# Patient Record
Sex: Female | Born: 1970 | Race: White | Hispanic: No | Marital: Married | Smoking: Never smoker
Health system: Southern US, Community
[De-identification: ages and names within clinical notes are randomized; demographics above are authoritative.]

## PROBLEM LIST (undated history)

## (undated) DIAGNOSIS — Z973 Presence of spectacles and contact lenses: Secondary | ICD-10-CM

## (undated) DIAGNOSIS — N939 Abnormal uterine and vaginal bleeding, unspecified: Secondary | ICD-10-CM

## (undated) DIAGNOSIS — N840 Polyp of corpus uteri: Secondary | ICD-10-CM

## (undated) DIAGNOSIS — N859 Noninflammatory disorder of uterus, unspecified: Secondary | ICD-10-CM

## (undated) DIAGNOSIS — Z8739 Personal history of other diseases of the musculoskeletal system and connective tissue: Secondary | ICD-10-CM

---

## 1995-04-21 DIAGNOSIS — Z8739 Personal history of other diseases of the musculoskeletal system and connective tissue: Secondary | ICD-10-CM

## 1995-04-21 HISTORY — DX: Personal history of other diseases of the musculoskeletal system and connective tissue: Z87.39

## 1995-04-21 HISTORY — PX: OTHER SURGICAL HISTORY: SHX169

## 2015-08-27 ENCOUNTER — Other Ambulatory Visit (HOSPITAL_COMMUNITY)
Admission: RE | Admit: 2015-08-27 | Discharge: 2015-08-27 | Disposition: A | Discharge status: Home / Self Care | Payer: BLUE CROSS/BLUE SHIELD | Source: Ambulatory Visit | Attending: Family Medicine | Admitting: Family Medicine

## 2015-08-27 ENCOUNTER — Other Ambulatory Visit: Payer: Self-pay | Admitting: Family Medicine

## 2015-08-27 DIAGNOSIS — Z1151 Encounter for screening for human papillomavirus (HPV): Secondary | ICD-10-CM | POA: Diagnosis not present

## 2015-08-27 DIAGNOSIS — Z01419 Encounter for gynecological examination (general) (routine) without abnormal findings: Secondary | ICD-10-CM | POA: Insufficient documentation

## 2015-08-28 LAB — CYTOLOGY - PAP

## 2016-05-25 ENCOUNTER — Emergency Department (HOSPITAL_COMMUNITY)
Admission: EM | Admit: 2016-05-25 | Discharge: 2016-05-25 | Disposition: A | Discharge status: Home / Self Care | Payer: 59 | Attending: Emergency Medicine | Admitting: Emergency Medicine

## 2016-05-25 ENCOUNTER — Emergency Department (HOSPITAL_COMMUNITY): Payer: 59

## 2016-05-25 ENCOUNTER — Encounter (HOSPITAL_COMMUNITY): Payer: Self-pay | Admitting: Emergency Medicine

## 2016-05-25 DIAGNOSIS — J09X2 Influenza due to identified novel influenza A virus with other respiratory manifestations: Secondary | ICD-10-CM | POA: Insufficient documentation

## 2016-05-25 DIAGNOSIS — J101 Influenza due to other identified influenza virus with other respiratory manifestations: Secondary | ICD-10-CM

## 2016-05-25 DIAGNOSIS — R05 Cough: Secondary | ICD-10-CM | POA: Diagnosis present

## 2016-05-25 MED ORDER — IBUPROFEN 800 MG PO TABS
800.0000 mg | ORAL_TABLET | Freq: Once | ORAL | Status: AC
  Administered 2016-05-25: 800 mg via ORAL
  Filled 2016-05-25: qty 1

## 2016-05-25 MED ORDER — ONDANSETRON 4 MG PO TBDP: 4.0000 mg | ORAL_TABLET | Freq: Three times a day (TID) | ORAL | 0 refills | Status: DC | PRN

## 2016-05-25 MED ORDER — SODIUM CHLORIDE 0.9 % IV BOLUS (SEPSIS)
1000.0000 mL | Freq: Once | INTRAVENOUS | Status: AC
  Administered 2016-05-25: 1000 mL via INTRAVENOUS

## 2016-05-25 NOTE — ED Provider Notes (Signed)
[facility] DEPT Provider Note   CSN: [id number] Arrival date & time: 05/25/16  [id number]     History   Chief Complaint Chief Complaint  Patient presents with  . Flu Like Symptoms    HPI Tiffany Bond is a 46 y.o. female.  The history is provided by the patient.  Fever   This is a new problem. Episode onset: 5 days ago. The problem occurs constantly. The problem has not changed since onset.The maximum temperature noted was 101 to 101.9 F. The temperature was taken using an oral thermometer. Associated symptoms include diarrhea, vomiting and cough. She has tried acetaminophen (mucinex) for the symptoms. The treatment provided mild relief.    History reviewed. No pertinent past medical history.  There are no active problems to display for this patient.   Past Surgical History:  Procedure Laterality Date  . BREAST SURGERY     Breast Reduction    OB History    No data available       Home Medications    Prior to Admission medications   Not on File    Family History No family history on file.  Social History Social History  Substance Use Topics  . Smoking status: Never Smoker  . Smokeless tobacco: Never Used  . Alcohol use No     Allergies   Vancomycin   Review of Systems Review of Systems  Constitutional: Positive for fever.  Respiratory: Positive for cough.   Gastrointestinal: Positive for diarrhea and vomiting.  All other systems reviewed and are negative.    Physical Exam Updated Vital Signs BP 97/57 (BP Location: Right Arm)   Pulse 88   Temp 103 F (39.4 C) (Oral)   Resp 16   Ht 5' (1.524 m)   Wt 152 lb (68.9 kg)   LMP 10/19/2015   SpO2 100%   BMI 29.69 kg/m   Physical Exam  Constitutional: She is oriented to person, place, and time. She appears well-developed and well-nourished. No distress.  HENT:  Head: Normocephalic.  Nose: Nose normal.  Mouth/Throat: Oropharynx is clear and moist.  Eyes: Conjunctivae are normal. Pupils are  equal, round, and reactive to light.  Neck: Neck supple. No tracheal deviation present.  Cardiovascular: Normal rate, regular rhythm and normal heart sounds.   Pulmonary/Chest: Effort normal and breath sounds normal. No respiratory distress.  Abdominal: Soft. She exhibits no distension. There is no tenderness. There is no rebound and no guarding.  Neurological: She is alert and oriented to person, place, and time.  Skin: Skin is warm and dry.  Psychiatric: She has a normal mood and affect.  Vitals reviewed.    ED Treatments / Results  Labs (all labs ordered are listed, but only abnormal results are displayed) Labs Reviewed - No data to display  EKG  EKG Interpretation None       Radiology Dg Chest 2 View  Result Date: 05/25/2016 CLINICAL DATA:  Cough, chest congestion, shortness of breath, and nausea and vomiting for the past 5 days. Recently diagnosed with flu. Nonsmoker. EXAM: CHEST  2 VIEW COMPARISON:  None in PACs FINDINGS: The lungs are mildly hyperinflated. There is no focal infiltrate. There is no pleural effusion. The heart and pulmonary vascularity are normal. The mediastinum is normal in width. The bony thorax exhibits no acute abnormality. IMPRESSION: Mild hyperinflation may be voluntary or may reflect mild air trapping as might be seen with reactive airway disease or bronchitis. No alveolar pneumonia or CHF. Electronically Signed   By: Shanon Brow  [place] M.D.   On: 05/25/2016 09:01    Procedures Procedures (including critical care time)  Medications Ordered in ED Medications  ibuprofen (ADVIL,MOTRIN) tablet 800 mg (800 mg Oral Given 05/25/16 0858)  sodium chloride 0.9 % bolus 1,000 mL (0 mLs Intravenous Stopped 05/25/16 1136)     Initial Impression / Assessment and Plan / ED Course  I have reviewed the triage vital signs and the nursing notes.  Pertinent labs & imaging results that were available during my care of the patient were reviewed by me and considered in my  medical decision making (see chart for details).     46 y.o. female presents with Cough, nausea, vomiting, diarrhea and fever that is been ongoing for the last 5 days. Overnight she was unable to control her symptoms. She sees Tamiflu late into the course of her symptoms starting yesterday for a flu A positive screen. Given prolonged nature of her fever we will screen her for a secondary bacterial pneumonia with chest x-ray. Currently febrile to 103 but otherwise no SIRS. Treated with supportive care, nausea improved after Zofran with EMS.  Plan to follow up with PCP as needed and return precautions discussed for worsening or new concerning symptoms.   Final Clinical Impressions(s) / ED Diagnoses   Final diagnoses:  Influenza A    New Prescriptions New Prescriptions   No medications on file     Leo Grosser, MD 05/25/16 1954

## 2016-05-25 NOTE — ED Notes (Signed)
Bed: WA02 Expected date:  Expected time:  Means of arrival:  Comments: EMS flu

## 2016-05-25 NOTE — ED Triage Notes (Addendum)
Per EMS, patient was dx with flu by [facility] yesterday. Patient is complaining of nausea, vomiting, diarrhea, cough, generalized body aches, fever. Symptoms starting Wednesday 05/20/2016. Patient received 600 ml NS IV, 4 mg zofran IV, 1000 mg tylenol PO

## 2017-02-24 ENCOUNTER — Ambulatory Visit: Payer: Self-pay | Admitting: Obstetrics & Gynecology

## 2017-03-11 IMAGING — CR DG CHEST 2V
2 series · 2 of 2 positions shown · non-contrast
Comparison: None in PACs

CLINICAL DATA: Cough, chest congestion, shortness of breath, and
nausea and vomiting for the past 5 days. Recently diagnosed with
flu. Nonsmoker.

EXAM:
CHEST  2 VIEW

[w chest pa]
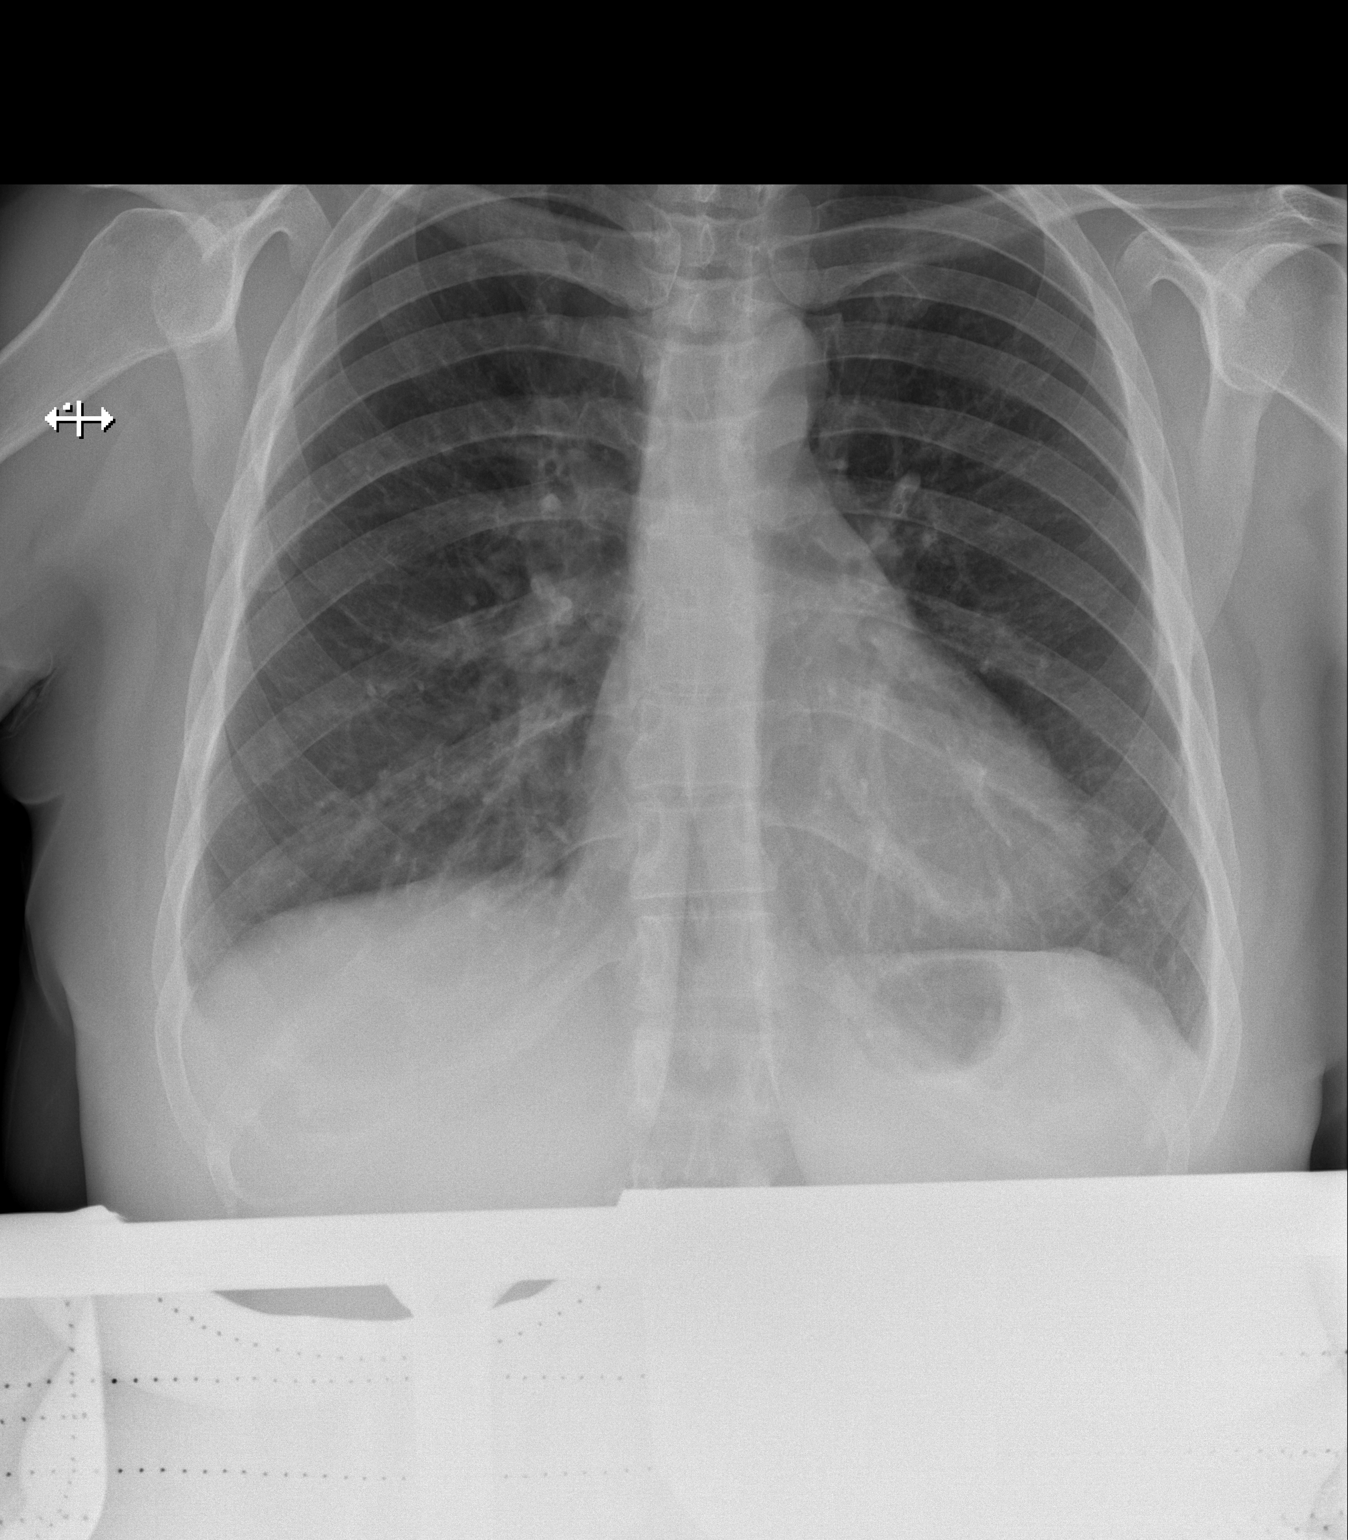

[w chest lat]
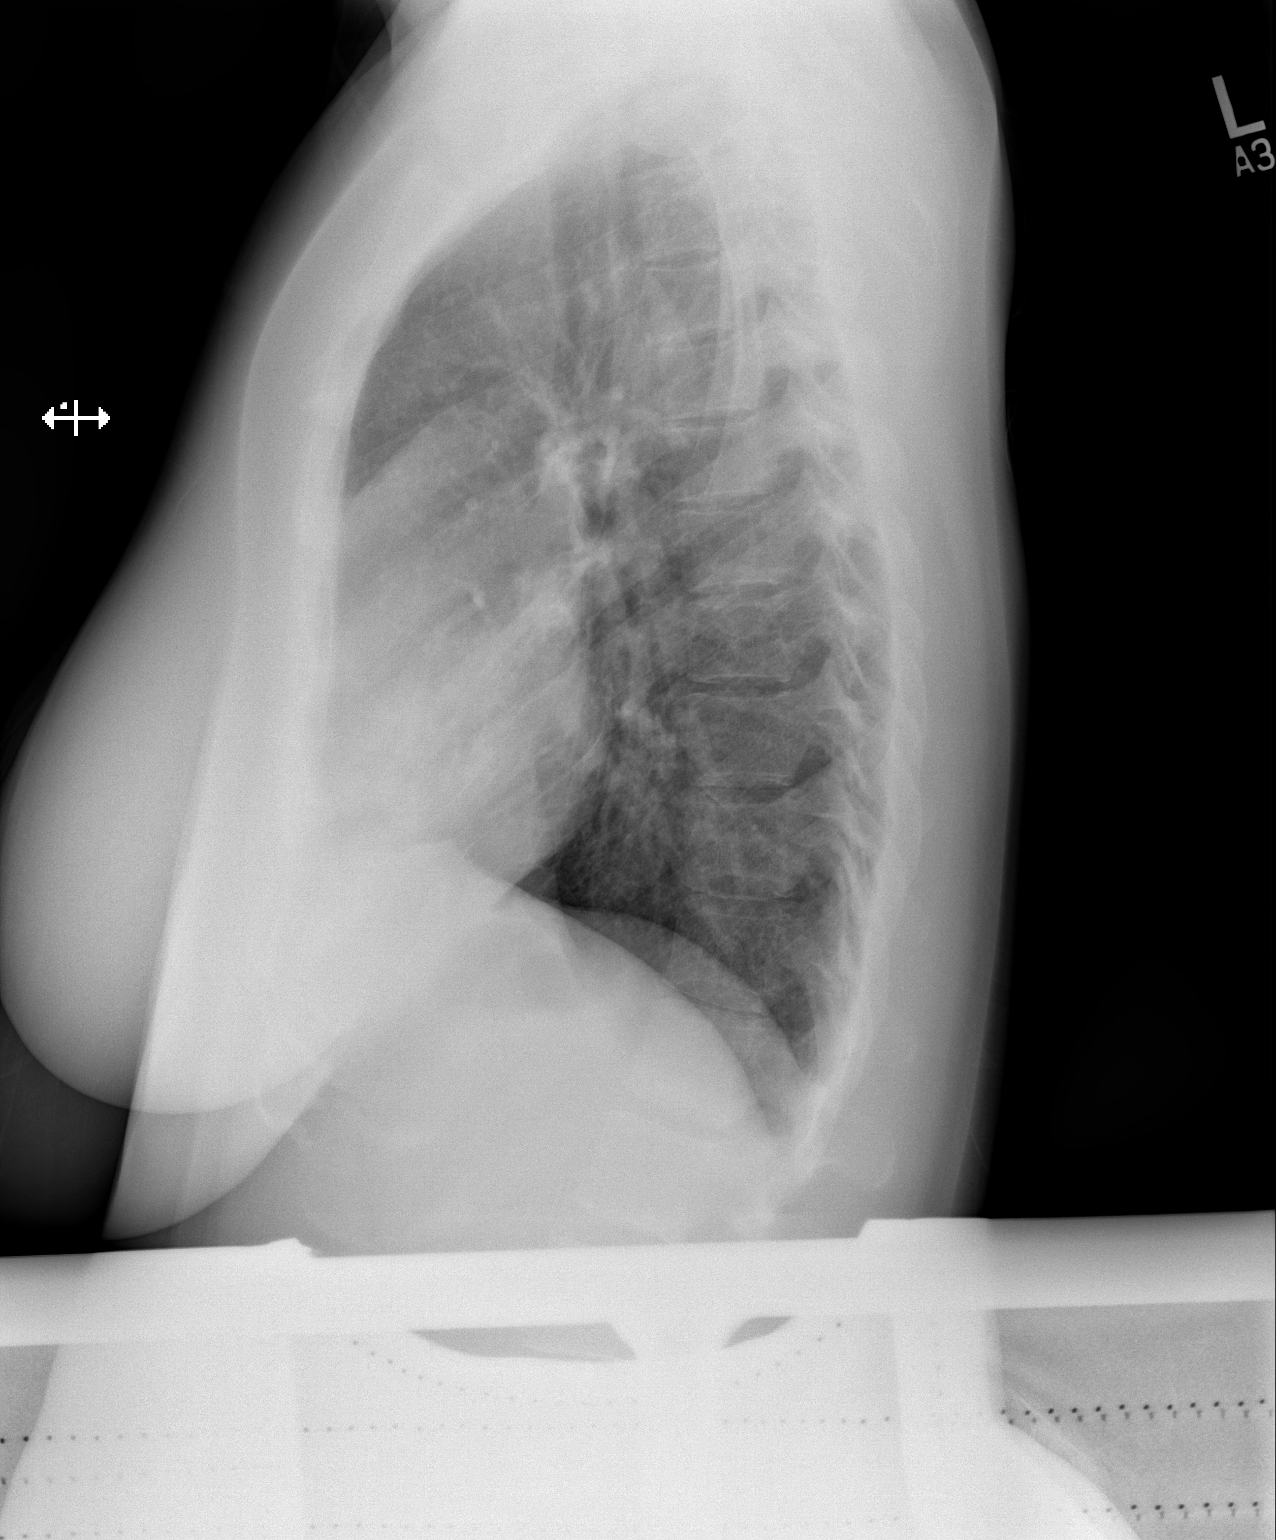

[2 of 2 positions shown; findings below may reference images not displayed]

FINDINGS: The lungs are mildly hyperinflated. There is no focal infiltrate.
There is no pleural effusion. The heart and pulmonary vascularity
are normal. The mediastinum is normal in width. The bony thorax
exhibits no acute abnormality.
IMPRESSION: Mild hyperinflation may be voluntary or may reflect mild air
trapping as might be seen with reactive airway disease or
bronchitis. No alveolar pneumonia or CHF.

## 2017-04-15 ENCOUNTER — Ambulatory Visit: Payer: Self-pay | Admitting: Obstetrics & Gynecology

## 2017-05-13 ENCOUNTER — Ambulatory Visit (INDEPENDENT_AMBULATORY_CARE_PROVIDER_SITE_OTHER): Payer: 59 | Admitting: Obstetrics & Gynecology

## 2017-05-13 ENCOUNTER — Encounter: Payer: Self-pay | Admitting: Obstetrics & Gynecology

## 2017-05-13 VITALS — BP 126/72 | Ht 59.0 in | Wt 169.0 lb

## 2017-05-13 DIAGNOSIS — N914 Secondary oligomenorrhea: Secondary | ICD-10-CM | POA: Diagnosis not present

## 2017-05-13 DIAGNOSIS — Z30011 Encounter for initial prescription of contraceptive pills: Secondary | ICD-10-CM

## 2017-05-13 DIAGNOSIS — Z1151 Encounter for screening for human papillomavirus (HPV): Secondary | ICD-10-CM | POA: Diagnosis not present

## 2017-05-13 DIAGNOSIS — Z01419 Encounter for gynecological examination (general) (routine) without abnormal findings: Secondary | ICD-10-CM | POA: Diagnosis not present

## 2017-05-13 MED ORDER — NORETHINDRONE 0.35 MG PO TABS: 1.0000 | ORAL_TABLET | Freq: Every day | ORAL | 4 refills | Status: DC

## 2017-05-13 NOTE — Patient Instructions (Signed)
1. Encounter for routine gynecological examination with Papanicolaou smear of cervix Normal gynecologic exam.  Pap test with high risk HPV done.  Breast exam normal status post bilateral breast reduction.  Will schedule screening mammogram.  Health labs with family physician.  Walks her dog daily.  Recommend lower calorie/carb nutrition.  2. Secondary oligomenorrhea Oligomenorrhea probably associated with oligo-ovulation of perimenopause.  Will check FSH and TSH today.  As a protection of the endometrium and for contraception, patient will start on the progestin-only birth control pill.  Usage, risks and benefits reviewed.  Prescription sent.  Patient will follow up to evaluate the endometrial line with a pelvic ultrasound. - FSH - TSH - US Transvaginal Non-OB; Future  3. Encounter for initial prescription of contraceptive pills Decision to start on the progestin only birth control pill.  No contraindication.  Usage, risks and benefits reviewed.  Prescription sent.  Other orders - norethindrone (MICRONOR,CAMILA,ERRIN) 0.35 MG tablet; Take 1 tablet (0.35 mg total) by mouth daily.  Tiffany Bond, it was a pleasure meeting you today!  I will inform you of your results as soon as they are available.  Perimenopause Perimenopause is the time when your body begins to move into the menopause (no menstrual period for 12 straight months). It is a natural process. Perimenopause can begin 2-8 years before the menopause and usually lasts for 1 year after the menopause. During this time, your ovaries may or may not produce an egg. The ovaries vary in their production of estrogen and progesterone hormones each month. This can cause irregular menstrual periods, difficulty getting pregnant, vaginal bleeding between periods, and uncomfortable symptoms. What are the causes?  Irregular production of the ovarian hormones, estrogen and progesterone, and not ovulating every month. Other causes include:  Tumor of the  pituitary gland in the brain.  Medical disease that affects the ovaries.  Radiation treatment.  Chemotherapy.  Unknown causes.  Heavy smoking and excessive alcohol intake can bring on perimenopause sooner.  What are the signs or symptoms?  Hot flashes.  Night sweats.  Irregular menstrual periods.  Decreased sex drive.  Vaginal dryness.  Headaches.  Mood swings.  Depression.  Memory problems.  Irritability.  Tiredness.  Weight gain.  Trouble getting pregnant.  The beginning of losing bone cells (osteoporosis).  The beginning of hardening of the arteries (atherosclerosis). How is this diagnosed? Your health care provider will make a diagnosis by analyzing your age, menstrual history, and symptoms. He or she will do a physical exam and note any changes in your body, especially your female organs. Female hormone tests may or may not be helpful depending on the amount of female hormones you produce and when you produce them. However, other hormone tests may be helpful to rule out other problems. How is this treated? In some cases, no treatment is needed. The decision on whether treatment is necessary during the perimenopause should be made by you and your health care provider based on how the symptoms are affecting you and your lifestyle. Various treatments are available, such as:  Treating individual symptoms with a specific medicine for that symptom.  Herbal medicines that can help specific symptoms.  Counseling.  Group therapy.  Follow these instructions at home:  Keep track of your menstrual periods (when they occur, how heavy they are, how long between periods, and how long they last) as well as your symptoms and when they started.  Only take over-the-counter or prescription medicines as directed by your health care provider.  Sleep and  rest.  Exercise.  Eat a diet that contains calcium (good for your bones) and soy (acts like the estrogen  hormone).  Do not smoke.  Avoid alcoholic beverages.  Take vitamin supplements as recommended by your health care provider. Taking vitamin E may help in certain cases.  Take calcium and vitamin D supplements to help prevent bone loss.  Group therapy is sometimes helpful.  Acupuncture may help in some cases. Contact a health care provider if:  You have questions about any symptoms you are having.  You need a referral to a specialist (gynecologist, psychiatrist, or psychologist). Get help right away if:  You have vaginal bleeding.  Your period lasts longer than 8 days.  Your periods are recurring sooner than 21 days.  You have bleeding after intercourse.  You have severe depression.  You have pain when you urinate.  You have severe headaches.  You have vision problems. This information is not intended to replace advice given to you by your health care provider. Make sure you discuss any questions you have with your health care provider. Document Released: 05/14/2004 Document Revised: 09/12/2015 Document Reviewed: 11/03/2012 Elsevier Interactive Patient Education  2017 [employer].

## 2017-05-13 NOTE — Progress Notes (Signed)
Tiffany Bond 07-Apr-1971 [id number]   History:    47 y.o. G2P1A1L1 Married x 15 years.  Daughter is 5 yo, doing well.  RP:  Established patient presenting for annual gyn exam   HPI:  Last menstrual period, light flow 11/2016.  Oligo x about 1 1/2 year.  Constant cramping and PMS symptoms.  Rare hot flushes.  No night sweats.  No problem with IC, using non-latex condoms.  Breasts wnl.  S/P Bilateral Breast reduction.  Urine and BMs wnl.  BMI 34.13.  Past medical history,surgical history, family history and social history were all reviewed and documented in the EPIC chart.  Gynecologic History Patient's last menstrual period was 11/26/2016. Contraception: condoms Last Pap: 2017. Results were: normal Last mammogram: 3 years ago. Results were: normal  Obstetric History OB History  Gravida Para Term Preterm AB Living  2 1     1 1   SAB TAB Ectopic Multiple Live Births  1            # Outcome Date GA Lbr Len/2nd Weight Sex Delivery Anes PTL Lv  2 SAB           1 Para                ROS: A ROS was performed and pertinent positives and negatives are included in the history.  GENERAL: No fevers or chills. HEENT: No change in vision, no earache, sore throat or sinus congestion. NECK: No pain or stiffness. CARDIOVASCULAR: No chest pain or pressure. No palpitations. PULMONARY: No shortness of breath, cough or wheeze. GASTROINTESTINAL: No abdominal pain, nausea, vomiting or diarrhea, melena or bright red blood per rectum. GENITOURINARY: No urinary frequency, urgency, hesitancy or dysuria. MUSCULOSKELETAL: No joint or muscle pain, no back pain, no recent trauma. DERMATOLOGIC: No rash, no itching, no lesions. ENDOCRINE: No polyuria, polydipsia, no heat or cold intolerance. No recent change in weight. HEMATOLOGICAL: No anemia or easy bruising or bleeding. NEUROLOGIC: No headache, seizures, numbness, tingling or weakness. PSYCHIATRIC: No depression, no loss of interest in normal activity or change  in sleep pattern.     Exam:   BP 126/72   Ht 4\' 11"  (1.499 m)   Wt 169 lb (76.7 kg)   LMP 11/26/2016   BMI 34.13 kg/m   Body mass index is 34.13 kg/m.  General appearance : Well developed well nourished female. No acute distress HEENT: Eyes: no retinal hemorrhage or exudates,  Neck supple, trachea midline, no carotid bruits, no thyroidmegaly Lungs: Clear to auscultation, no rhonchi or wheezes, or rib retractions  Heart: Regular rate and rhythm, no murmurs or gallops Breast:Examined in sitting and supine position were symmetrical in appearance, no palpable masses or tenderness,  no skin retraction, no nipple inversion, no nipple discharge, no skin discoloration, no axillary or supraclavicular lymphadenopathy Abdomen: no palpable masses or tenderness, no rebound or guarding Extremities: no edema or skin discoloration or tenderness  Pelvic: Vulva normal  Bartholin, Urethra, Skene Glands: Within normal limits             Vagina: No gross lesions or discharge  Cervix: No gross lesions or discharge.  Pap/HR HPV done.  Uterus  AV, normal size, shape and consistency, non-tender and mobile  Adnexa  Without masses or tenderness  Anus and perineum  normal    Assessment/Plan:  48 y.o. female for annual exam  1. Encounter for routine gynecological examination with Papanicolaou smear of cervix Normal gynecologic exam.  Pap test with high risk HPV  done.  Breast exam normal status post bilateral breast reduction.  Will schedule screening mammogram.  Health labs with family physician.  Walks her dog daily.  Recommend lower calorie/carb nutrition.  2. Secondary oligomenorrhea Oligomenorrhea probably associated with oligo-ovulation of perimenopause.  Will check FSH and TSH today.  As a protection of the endometrium and for contraception, patient will start on the progestin-only birth control pill.  Usage, risks and benefits reviewed.  Prescription sent.  Patient will follow up to evaluate the  endometrial line with a pelvic ultrasound. - FSH - TSH - US Transvaginal Non-OB; Future  3. Encounter for initial prescription of contraceptive pills Decision to start on the progestin only birth control pill.  No contraindication.  Usage, risks and benefits reviewed.  Prescription sent.  Other orders - norethindrone (MICRONOR,CAMILA,ERRIN) 0.35 MG tablet; Take 1 tablet (0.35 mg total) by mouth daily.  Princess Bruins MD, 11:06 AM 05/13/2017

## 2017-05-13 NOTE — Addendum Note (Signed)
Addended by: Thurnell Garbe A on: 05/13/2017 11:57 AM   Modules accepted: Orders

## 2017-05-16 LAB — TEST AUTHORIZATION

## 2017-05-16 LAB — T4, FREE: FREE T4: 1.3 ng/dL (ref 0.8–1.8)

## 2017-05-16 LAB — FOLLICLE STIMULATING HORMONE: FSH: 75 m[IU]/mL

## 2017-05-16 LAB — T3, FREE: T3, Free: 3 pg/mL (ref 2.3–4.2)

## 2017-05-16 LAB — TSH: TSH: 4.59 m[IU]/L — AB

## 2017-05-17 LAB — PAP, TP IMAGING W/ HPV RNA, RFLX HPV TYPE 16,18/45: HPV DNA High Risk: NOT DETECTED

## 2017-05-19 ENCOUNTER — Telehealth: Payer: Self-pay

## 2017-05-19 NOTE — Telephone Encounter (Signed)
-----   Message from Princess Bruins, MD sent at 05/18/2017 10:09 AM EST ----- Pap negative, HPV HR neg.

## 2017-05-19 NOTE — Telephone Encounter (Signed)
Called patient and informed her normal pap. She asked about her Thyroid testing. Her TSH was abnormal and you ordered T3 and T4.  What to advise?

## 2017-05-20 NOTE — Telephone Encounter (Signed)
FT3, FT4 normal.  Repeat TSH next year.

## 2017-05-20 NOTE — Telephone Encounter (Signed)
Spoke with patient and informed her. °

## 2017-05-20 NOTE — Telephone Encounter (Signed)
Please tell her that we are waiting on FT3, FT4 for a complete evaluation of her Thyroid function and will let her know as soon as available.

## 2017-05-20 NOTE — Telephone Encounter (Signed)
I see T3 and T4 results from 05/13/17 in her chart already.

## 2017-06-09 ENCOUNTER — Ambulatory Visit (INDEPENDENT_AMBULATORY_CARE_PROVIDER_SITE_OTHER): Payer: 59

## 2017-06-09 ENCOUNTER — Encounter: Payer: Self-pay | Admitting: Obstetrics & Gynecology

## 2017-06-09 ENCOUNTER — Ambulatory Visit (INDEPENDENT_AMBULATORY_CARE_PROVIDER_SITE_OTHER): Payer: 59 | Admitting: Obstetrics & Gynecology

## 2017-06-09 ENCOUNTER — Other Ambulatory Visit: Payer: Self-pay | Admitting: Obstetrics & Gynecology

## 2017-06-09 VITALS — BP 120/78

## 2017-06-09 DIAGNOSIS — N914 Secondary oligomenorrhea: Secondary | ICD-10-CM | POA: Diagnosis not present

## 2017-06-09 DIAGNOSIS — N951 Menopausal and female climacteric states: Secondary | ICD-10-CM | POA: Diagnosis not present

## 2017-06-09 DIAGNOSIS — R9389 Abnormal findings on diagnostic imaging of other specified body structures: Secondary | ICD-10-CM

## 2017-06-09 DIAGNOSIS — D251 Intramural leiomyoma of uterus: Secondary | ICD-10-CM | POA: Diagnosis not present

## 2017-06-09 NOTE — Progress Notes (Signed)
    Tiffany Bond 1971-01-30 [id number]        46 y.o.  G2P0011 Married  RP: Perimenopausal irregular bleeding with pelvic pain for Pelvic US  HPI: Started on Progestin only pill x 05/13/2017.  Had a light menstrual period o who n it June 06, 2017.  No pelvic pain currently.   OB History  Gravida Para Term Preterm AB Living  2 1     1 1   SAB TAB Ectopic Multiple Live Births  1            # Outcome Date GA Lbr Len/2nd Weight Sex Delivery Anes PTL Lv  2 SAB           1 Para               Past medical history,surgical history, problem list, medications, allergies, family history and social history were all reviewed and documented in the EPIC chart.   Directed ROS with pertinent positives and negatives documented in the history of present illness/assessment and plan.  Exam:  Vitals:   06/09/17 1059  BP: 120/78   General appearance:  Normal  Pelvic US today: T/V and T/A images.  Anteverted uterus measuring 8.37 x 7.35 x 4.74 cm.  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  Endometrial lining measured at 6.8 mm with a cystic/solid focus measuring 1.2 x 0.7 cm.  Right ovary not seen because of excessive bowel shadows.  Left ovary with 2 small cystic areas with internal low-level echoes and negative color flow Doppler, measuring 2.1 x 2.0 cm and 1.1 x 1.0 cm.  Small amount of free fluid in the posterior cul-de-sac measuring 1.4 x 1.0 cm.  Labs on 05/13/2017: [place] [facility] TSH 4.59, FT3 3.0, FT4 1.3.   Assessment/Plan:  47 y.o. G2P0011   1. Perimenopause Started on progestin only pill.  Well-tolerated.  2. Increased endometrial stripe thickness Endometrial focused cystic/solid measuring 1.2 x 0.7 cm.  Possible polyp.  Follow-up sonohysterogram to assess, possible endometrial biopsy. - [place] Sonohysterogram; Future  Counseling on above issues more than 50% for 15 minutes.  Princess Bruins MD, 11:14 AM 06/09/2017

## 2017-06-11 ENCOUNTER — Encounter: Payer: Self-pay | Admitting: Obstetrics & Gynecology

## 2017-06-11 NOTE — Patient Instructions (Addendum)
1. Perimenopause Started on progestin only pill.  Well-tolerated.  2. Increased endometrial stripe thickness Endometrial focused cystic/solid measuring 1.2 x 0.7 cm.  Possible polyp.  Follow-up sonohysterogram to assess, possible endometrial biopsy. - [place] Sonohysterogram; Future  Tiffany Bond, good seeing you today!   Sonohysterogram A sonohysterogram is a procedure to examine the inside of the uterus. This exam uses sound waves that are sent to a computer to make images of the lining of the uterus (endometrium). To get the best images, a germ-free, salt-water solution (sterile saline) is put into the uterus through the vagina. You may have this procedure if you have certain reproductive problems, such as abnormal bleeding, infertility, or miscarriage. This procedure can show what may be causing these problems. Possible causes include scarring or abnormal growths such as fibroids inside your uterus. It can also show if your uterus is an abnormal shape or if the lining of the uterus is too thin. Tell a health care provider about:  All medicines you are taking, including vitamins, herbs, eye drops, creams, and over-the-counter medicines.  Any allergies you have.  Any blood disorders you have.  Any surgeries you have had.  Any medical conditions you have.  Whether you are pregnant or may be pregnant.  The date of the first day of your last period.  Any signs of infection, such as fever, pain in your lower abdomen, or abnormal discharge from your vagina. What are the risks? Generally, this is a safe procedure. However, problems may occur, including:  Abdominal pain or cramping.  Light bleeding (spotting).  Increased vaginal discharge.  Infection.  What happens before the procedure?  Your health care provider may have you take an over-the-counter pain medicine.  You may be given medicine to stop any abnormal bleeding.  You may be given antibiotic medicine to help prevent  infection.  You may be asked to take a pregnancy test. This is usually in the form of a urine test.  You may have a pelvic exam.  You will be asked to empty your bladder. What happens during the procedure?  You will lie down on the exam table with your feet in stirrups or with your knees bent and your feet flat on the table.  A slender, handheld device (transducer) will be lubricated and placed into your vagina.  The transducer will be positioned to send sound waves to your uterus. The sound waves are sent to a computer and are turned into images, which your health care provider sees during the procedure.  The transducer will be removed from your vagina.  An instrument will be inserted to widen the opening of your vagina (speculum).  A swab with germ-killing solution (antiseptic) will be used to clean the opening to your uterus (cervix).  A long, thin tube (catheter) will be placed through your cervix into your uterus.  The speculum will be removed.  The transducer will be placed back into your vagina to take more images.  Your uterus will be filled with a germ-free, salt-water solution (sterile saline) through the catheter. You may feel some cramping.  A fluid that contains air bubbles may be sent through the catheter to make it easier to see the fallopian tubes.  The transducer and catheter will be removed. The procedure may vary among health care providers and hospitals. What happens after the procedure?  It is up to you to get the results of your procedure. Ask your health care provider, or the department that is doing the procedure,  when your results will be ready. Summary  A sonohysterogram is a procedure that creates images of the inside of the uterus.  The risks of this procedure are very low. Most women experience cramping and spotting after the procedure.  You may need to have a pelvic exam and take a pregnancy test before this procedure. This procedure will not be  done if you are pregnant or have an infection. This information is not intended to replace advice given to you by your health care provider. Make sure you discuss any questions you have with your health care provider. Document Released: 08/21/2013 Document Revised: 03/02/2016 Document Reviewed: 03/02/2016 Elsevier Interactive Patient Education  2017 [employer].

## 2017-06-16 ENCOUNTER — Other Ambulatory Visit: Payer: Self-pay | Admitting: Obstetrics & Gynecology

## 2017-06-16 ENCOUNTER — Ambulatory Visit (INDEPENDENT_AMBULATORY_CARE_PROVIDER_SITE_OTHER): Payer: 59 | Admitting: Obstetrics & Gynecology

## 2017-06-16 ENCOUNTER — Telehealth: Payer: Self-pay

## 2017-06-16 ENCOUNTER — Ambulatory Visit (INDEPENDENT_AMBULATORY_CARE_PROVIDER_SITE_OTHER): Payer: 59

## 2017-06-16 DIAGNOSIS — N9489 Other specified conditions associated with female genital organs and menstrual cycle: Secondary | ICD-10-CM | POA: Diagnosis not present

## 2017-06-16 DIAGNOSIS — R9389 Abnormal findings on diagnostic imaging of other specified body structures: Secondary | ICD-10-CM | POA: Diagnosis not present

## 2017-06-16 DIAGNOSIS — N840 Polyp of corpus uteri: Secondary | ICD-10-CM

## 2017-06-16 DIAGNOSIS — N950 Postmenopausal bleeding: Secondary | ICD-10-CM | POA: Diagnosis not present

## 2017-06-16 NOTE — Progress Notes (Signed)
Tiffany Bond [birth year] [id number]        47 y.o.  G2P0011   RP: PMB with IU focus for Sonohysterogram  HPI: No change since visit on 06/09/2017 when we noted:  Started on Progestin only pill x 05/13/2017.  Had a light menstrual period on it June 06, 2017.  No pelvic pain currently.  Pelvic US on 06/09/2017 showed an IU focus 1.2 x 0.7 cm.   OB History  Gravida Para Term Preterm AB Living  2 1     1 1   SAB TAB Ectopic Multiple Live Births  1            # Outcome Date GA Lbr Len/2nd Weight Sex Delivery Anes PTL Lv  2 SAB           1 Para               Past medical history,surgical history, problem list, medications, allergies, family history and social history were all reviewed and documented in the EPIC chart.   Directed ROS with pertinent positives and negatives documented in the history of present illness/assessment and plan.  Exam:  There were no vitals filed for this visit. General appearance:  Normal                                                                    Sono Infusion Hysterogram ( procedure note)   The initial transvaginal ultrasound demonstrated the following: T/V images.  No change since prior ultrasound.  Anteverted uterus with intramural fibroid measuring 4.6 x 3.3 cm and displacing the endometrium.  Cystic area in the endometrium.  Right ovary seen behind the uterus and normal.  Left ovary with 2 small cystic areas measuring 1.6 x 1.4 cm and 1.5 x 1.1 cm.  Negative color flow Doppler.  No free fluid in the posterior to the sac.  The speculum  was inserted and the cervix cleansed with Betadine solution after confirming that patient has no allergies.A small sonohysterography catheterwas utilized.  Insertion was facilitated with ring forceps, using a spear-like motion the catheter was inserted to the fundus of the uterus. The speculum is then removed carefully to avoid dislodging the catheter. The catheter was flushed with sterile saline delete prior  to insertion to rid it of small amounts of air.the sterile saline solution was infused into the uterine cavity as a vaginal ultrasound probe was then placed in the vagina for full visualization of the uterine cavity from a transvaginal approach. The following was noted: Following injection of saline, the endometrium is filled and  Defects are seen measuring 7 x 6 mm and 8 x 5 mm.  The intramural fibroid is not encroaching the endometrial cavity.  The catheter was then removed after retrieving some of the saline from the intrauterine cavity. An endometrial biopsy was not done. Patient tolerated procedure well. She had received a tablet of Aleve for discomfort.    Assessment/Plan:  47 y.o. G2P0011   1. Postmenopausal bleeding Probably due to endometrial polyps  2. Endometrial polyps 2 intrauterine defects seen on sonohysterogram measuring 7 mm and 8 mm, probably corresponding to endometrial polyps.  Decision to proceed with hysteroscopy with Myosure resection of polyps and D&C.  Surgery and risks reviewed.  Follow-up preop evaluation.    Counseling on above issues more than 50% for 15 minutes.  Princess Bruins MD, 9:26 AM 06/16/2017

## 2017-06-16 NOTE — Telephone Encounter (Signed)
I called patient to discuss arranging surgery. We discussed her insurance benefits and estimated GGA surgery prepymt due by one week prior to surgery.  We discussed dates. She wants to consider and call me back to schedule.

## 2017-06-17 ENCOUNTER — Encounter: Payer: Self-pay | Admitting: Obstetrics & Gynecology

## 2017-06-17 NOTE — Patient Instructions (Signed)
1. Postmenopausal bleeding Probably due to endometrial polyps  2. Endometrial polyps 2 intrauterine defects seen on sonohysterogram measuring 7 mm and 8 mm, probably corresponding to endometrial polyps.  Decision to proceed with hysteroscopy with Myosure resection of polyps and D&C.  Surgery and risks reviewed.  Follow-up preop evaluation.    Sharyn Lull, good seeing you today!  Hysteroscopy Hysteroscopy is a procedure used for looking inside the womb (uterus). It may be done for various reasons, including:  To evaluate abnormal bleeding, fibroid (benign, noncancerous) tumors, polyps, scar tissue (adhesions), and possibly cancer of the uterus.  To look for lumps (tumors) and other uterine growths.  To look for causes of why a woman cannot get pregnant (infertility), causes of recurrent loss of pregnancy (miscarriages), or a lost intrauterine device (IUD).  To perform a sterilization by blocking the fallopian tubes from inside the uterus.  In this procedure, a thin, flexible tube with a tiny light and camera on the end of it (hysteroscope) is used to look inside the uterus. A hysteroscopy should be done right after a menstrual period to be sure you are not pregnant. LET [facility] CARE PROVIDER KNOW ABOUT:  Any allergies you have.  All medicines you are taking, including vitamins, herbs, eye drops, creams, and over-the-counter medicines.  Previous problems you or members of your family have had with the use of anesthetics.  Any blood disorders you have.  Previous surgeries you have had.  Medical conditions you have. RISKS AND COMPLICATIONS Generally, this is a safe procedure. However, as with any procedure, complications can occur. Possible complications include:  Putting a hole in the uterus.  Excessive bleeding.  Infection.  Damage to the cervix.  Injury to other organs.  Allergic reaction to medicines.  Too much fluid used in the uterus for the procedure.  BEFORE THE  PROCEDURE  Ask your health care provider about changing or stopping any regular medicines.  Do not take aspirin or blood thinners for 1 week before the procedure, or as directed by your health care provider. These can cause bleeding.  If you smoke, do not smoke for 2 weeks before the procedure.  In some cases, a medicine is placed in the cervix the day before the procedure. This medicine makes the cervix have a larger opening (dilate). This makes it easier for the instrument to be inserted into the uterus during the procedure.  Do not eat or drink anything for at least 8 hours before the surgery.  Arrange for someone to take you home after the procedure. PROCEDURE  You may be given a medicine to relax you (sedative). You may also be given one of the following: ? A medicine that numbs the area around the cervix (local anesthetic). ? A medicine that makes you sleep through the procedure (general anesthetic).  The hysteroscope is inserted through the vagina into the uterus. The camera on the hysteroscope sends a picture to a TV screen. This gives the surgeon a good view inside the uterus.  During the procedure, air or a liquid is put into the uterus, which allows the surgeon to see better.  Sometimes, tissue is gently scraped from inside the uterus. These tissue samples are sent to a lab for testing. What to expect after the procedure  If you had a general anesthetic, you may be groggy for a couple hours after the procedure.  If you had a local anesthetic, you will be able to go home as soon as you are stable and feel  ready.  You may have some cramping. This normally lasts for a couple days.  You may have bleeding, which varies from light spotting for a few days to menstrual-like bleeding for 3-7 days. This is normal.  If your test results are not back during the visit, make an appointment with your health care provider to find out the results. This information is not intended to  replace advice given to you by your health care provider. Make sure you discuss any questions you have with your health care provider. Document Released: 07/13/2000 Document Revised: 09/12/2015 Document Reviewed: 11/03/2012 Elsevier Interactive Patient Education  2017 [employer].

## 2017-06-21 ENCOUNTER — Encounter: Payer: Self-pay | Admitting: Anesthesiology

## 2017-06-21 ENCOUNTER — Telehealth: Payer: Self-pay

## 2017-06-21 NOTE — Telephone Encounter (Signed)
Patient called today asking to r/s her surgery date. I moved her to 07/02/17 at 7:30am at her request. Patient informed.

## 2017-06-23 ENCOUNTER — Encounter (HOSPITAL_BASED_OUTPATIENT_CLINIC_OR_DEPARTMENT_OTHER): Payer: Self-pay | Admitting: *Deleted

## 2017-06-23 ENCOUNTER — Other Ambulatory Visit: Payer: Self-pay

## 2017-06-23 NOTE — Progress Notes (Signed)
SPOKE W/ PT VIA PHONE FOR PRE-OP INTERVIEW.  ARRIVE AT 0530.  NEEDS URINE PREG.  GETTING CBC DONE Tuesday 06-29-2017 AT 1300. WILL TAKE AM MED DOS W/ SIPS OF WATER.

## 2017-06-29 ENCOUNTER — Encounter (HOSPITAL_COMMUNITY)
Admission: RE | Admit: 2017-06-29 | Discharge: 2017-06-29 | Disposition: A | Discharge status: Home / Self Care | Payer: 59 | Source: Ambulatory Visit | Attending: Obstetrics & Gynecology | Admitting: Obstetrics & Gynecology

## 2017-06-29 DIAGNOSIS — N840 Polyp of corpus uteri: Secondary | ICD-10-CM | POA: Diagnosis not present

## 2017-06-29 DIAGNOSIS — N939 Abnormal uterine and vaginal bleeding, unspecified: Secondary | ICD-10-CM | POA: Diagnosis not present

## 2017-06-29 DIAGNOSIS — Z79890 Hormone replacement therapy (postmenopausal): Secondary | ICD-10-CM | POA: Diagnosis not present

## 2017-06-29 LAB — CBC
HCT: 42.7 % (ref 36.0–46.0)
Hemoglobin: 14.4 g/dL (ref 12.0–15.0)
MCH: 29.6 pg (ref 26.0–34.0)
MCHC: 33.7 g/dL (ref 30.0–36.0)
MCV: 87.9 fL (ref 78.0–100.0)
PLATELETS: 329 10*3/uL (ref 150–400)
RBC: 4.86 MIL/uL (ref 3.87–5.11)
RDW: 13.3 % (ref 11.5–15.5)
WBC: 8.1 10*3/uL (ref 4.0–10.5)

## 2017-07-02 ENCOUNTER — Ambulatory Visit (HOSPITAL_BASED_OUTPATIENT_CLINIC_OR_DEPARTMENT_OTHER): Payer: 59 | Admitting: Anesthesiology

## 2017-07-02 ENCOUNTER — Ambulatory Visit (HOSPITAL_BASED_OUTPATIENT_CLINIC_OR_DEPARTMENT_OTHER)
Admission: RE | Admit: 2017-07-02 | Discharge: 2017-07-02 | Disposition: A | Discharge status: Home / Self Care | Payer: 59 | Source: Ambulatory Visit | Attending: Obstetrics & Gynecology | Admitting: Obstetrics & Gynecology

## 2017-07-02 ENCOUNTER — Encounter (HOSPITAL_BASED_OUTPATIENT_CLINIC_OR_DEPARTMENT_OTHER)
Admission: RE | Disposition: A | Discharge status: Home / Self Care | Payer: Self-pay | Source: Ambulatory Visit | Attending: Obstetrics & Gynecology

## 2017-07-02 ENCOUNTER — Encounter (HOSPITAL_BASED_OUTPATIENT_CLINIC_OR_DEPARTMENT_OTHER): Payer: Self-pay

## 2017-07-02 DIAGNOSIS — Z79890 Hormone replacement therapy (postmenopausal): Secondary | ICD-10-CM | POA: Insufficient documentation

## 2017-07-02 DIAGNOSIS — N939 Abnormal uterine and vaginal bleeding, unspecified: Secondary | ICD-10-CM | POA: Insufficient documentation

## 2017-07-02 DIAGNOSIS — N840 Polyp of corpus uteri: Secondary | ICD-10-CM

## 2017-07-02 HISTORY — DX: Presence of spectacles and contact lenses: Z97.3

## 2017-07-02 HISTORY — DX: Abnormal uterine and vaginal bleeding, unspecified: N93.9

## 2017-07-02 HISTORY — DX: Polyp of corpus uteri: N84.0

## 2017-07-02 HISTORY — DX: Personal history of other diseases of the musculoskeletal system and connective tissue: Z87.39

## 2017-07-02 HISTORY — PX: DILATATION & CURETTAGE/HYSTEROSCOPY WITH MYOSURE: SHX6511

## 2017-07-02 HISTORY — DX: Noninflammatory disorder of uterus, unspecified: N85.9

## 2017-07-02 LAB — POCT PREGNANCY, URINE: PREG TEST UR: NEGATIVE

## 2017-07-02 SURGERY — DILATATION & CURETTAGE/HYSTEROSCOPY WITH MYOSURE
Anesthesia: General | Site: Uterus

## 2017-07-02 MED ORDER — LIDOCAINE 2% (20 MG/ML) 5 ML SYRINGE
INTRAMUSCULAR | Status: AC
  Filled 2017-07-02: qty 5

## 2017-07-02 MED ORDER — FENTANYL CITRATE (PF) 100 MCG/2ML IJ SOLN
INTRAMUSCULAR | Status: AC
  Filled 2017-07-02: qty 2

## 2017-07-02 MED ORDER — PROMETHAZINE HCL 25 MG/ML IJ SOLN
6.2500 mg | INTRAMUSCULAR | Status: DC | PRN
  Filled 2017-07-02: qty 1

## 2017-07-02 MED ORDER — ARTIFICIAL TEARS OPHTHALMIC OINT
TOPICAL_OINTMENT | OPHTHALMIC | Status: AC
Start: 2017-07-02 — End: ?
  Filled 2017-07-02: qty 3.5

## 2017-07-02 MED ORDER — CHLOROPROCAINE HCL 1 % IJ SOLN
INTRAMUSCULAR | Status: DC | PRN
  Administered 2017-07-02: 20 mL

## 2017-07-02 MED ORDER — ONDANSETRON HCL 4 MG/2ML IJ SOLN
INTRAMUSCULAR | Status: AC
  Filled 2017-07-02: qty 2

## 2017-07-02 MED ORDER — OXYCODONE HCL 5 MG PO TABS
5.0000 mg | ORAL_TABLET | Freq: Once | ORAL | Status: DC | PRN
  Filled 2017-07-02: qty 1

## 2017-07-02 MED ORDER — MIDAZOLAM HCL 2 MG/2ML IJ SOLN
INTRAMUSCULAR | Status: AC
  Filled 2017-07-02: qty 2

## 2017-07-02 MED ORDER — KETOROLAC TROMETHAMINE 30 MG/ML IJ SOLN
INTRAMUSCULAR | Status: DC | PRN
  Administered 2017-07-02: 30 mg via INTRAVENOUS

## 2017-07-02 MED ORDER — HYDROMORPHONE HCL 1 MG/ML IJ SOLN
0.2500 mg | INTRAMUSCULAR | Status: DC | PRN
  Filled 2017-07-02: qty 0.5

## 2017-07-02 MED ORDER — KETOROLAC TROMETHAMINE 30 MG/ML IJ SOLN
INTRAMUSCULAR | Status: AC
  Filled 2017-07-02: qty 1

## 2017-07-02 MED ORDER — LACTATED RINGERS IV SOLN
INTRAVENOUS | Status: DC
  Administered 2017-07-02 (×2): via INTRAVENOUS
  Filled 2017-07-02: qty 1000

## 2017-07-02 MED ORDER — PROPOFOL 10 MG/ML IV BOLUS
INTRAVENOUS | Status: DC | PRN
  Administered 2017-07-02: 200 mg via INTRAVENOUS

## 2017-07-02 MED ORDER — ONDANSETRON HCL 4 MG/2ML IJ SOLN
INTRAMUSCULAR | Status: DC | PRN
  Administered 2017-07-02: 4 mg via INTRAVENOUS

## 2017-07-02 MED ORDER — MIDAZOLAM HCL 5 MG/5ML IJ SOLN
INTRAMUSCULAR | Status: DC | PRN
  Administered 2017-07-02: 2 mg via INTRAVENOUS

## 2017-07-02 MED ORDER — FENTANYL CITRATE (PF) 100 MCG/2ML IJ SOLN
INTRAMUSCULAR | Status: DC | PRN
  Administered 2017-07-02: 50 ug via INTRAVENOUS

## 2017-07-02 MED ORDER — DEXAMETHASONE SODIUM PHOSPHATE 10 MG/ML IJ SOLN
INTRAMUSCULAR | Status: AC
  Filled 2017-07-02: qty 1

## 2017-07-02 MED ORDER — DEXAMETHASONE SODIUM PHOSPHATE 4 MG/ML IJ SOLN
INTRAMUSCULAR | Status: DC | PRN
  Administered 2017-07-02: 10 mg via INTRAVENOUS

## 2017-07-02 MED ORDER — LACTATED RINGERS IV SOLN
INTRAVENOUS | Status: DC
  Filled 2017-07-02: qty 1000

## 2017-07-02 MED ORDER — CEFAZOLIN SODIUM-DEXTROSE 2-4 GM/100ML-% IV SOLN
2.0000 g | INTRAVENOUS | Status: AC
  Administered 2017-07-02: 2 g via INTRAVENOUS
  Filled 2017-07-02: qty 100

## 2017-07-02 MED ORDER — PROPOFOL 10 MG/ML IV BOLUS
INTRAVENOUS | Status: AC
  Filled 2017-07-02: qty 40

## 2017-07-02 MED ORDER — MEPERIDINE HCL 25 MG/ML IJ SOLN
6.2500 mg | INTRAMUSCULAR | Status: DC | PRN
Start: 2017-07-02 — End: 2017-07-02
  Filled 2017-07-02: qty 1

## 2017-07-02 MED ORDER — OXYCODONE HCL 5 MG/5ML PO SOLN
5.0000 mg | Freq: Once | ORAL | Status: DC | PRN
  Filled 2017-07-02: qty 5

## 2017-07-02 MED ORDER — SODIUM CHLORIDE 0.9 % IR SOLN
Status: DC | PRN
  Administered 2017-07-02: 3000 mL

## 2017-07-02 MED ORDER — LIDOCAINE 2% (20 MG/ML) 5 ML SYRINGE
INTRAMUSCULAR | Status: DC | PRN
  Administered 2017-07-02: 100 mg via INTRAVENOUS

## 2017-07-02 MED ORDER — CEFAZOLIN SODIUM-DEXTROSE 2-4 GM/100ML-% IV SOLN
INTRAVENOUS | Status: AC
  Filled 2017-07-02: qty 100

## 2017-07-02 SURGICAL SUPPLY — 30 items
CANISTER SUCT 3000ML PPV (MISCELLANEOUS) ×3 IMPLANT
CATH FOLEY 2WAY  5CC 16FR SIL (CATHETERS) ×2
CATH FOLEY 2WAY 5CC 16FR SIL (CATHETERS) ×1 IMPLANT
CATH ROBINSON RED A/P 16FR (CATHETERS) IMPLANT
COUNTER NEEDLE 1200 MAGNETIC (NEEDLE) ×3 IMPLANT
DEVICE MYOSURE LITE (MISCELLANEOUS) ×3 IMPLANT
DEVICE MYOSURE REACH (MISCELLANEOUS) IMPLANT
DILATOR CANAL MILEX (MISCELLANEOUS) IMPLANT
ELECT REM PT RETURN 9FT ADLT (ELECTROSURGICAL)
ELECTRODE REM PT RTRN 9FT ADLT (ELECTROSURGICAL) IMPLANT
FILTER ARTHROSCOPY CONVERTOR (FILTER) ×3 IMPLANT
GLOVE BIO SURGEON STRL SZ 6.5 (GLOVE) ×2 IMPLANT
GLOVE BIO SURGEONS STRL SZ 6.5 (GLOVE) ×1
GLOVE BIOGEL PI IND STRL 7.0 (GLOVE) ×2 IMPLANT
GLOVE BIOGEL PI INDICATOR 7.0 (GLOVE) ×4
GLOVE EUDERMIC 6.5 POWDERFREE (GLOVE) ×3 IMPLANT
GLOVE EUDERMIC 7 POWDERFREE (GLOVE) ×3 IMPLANT
GLOVE INDICATOR 6.5 STRL GRN (GLOVE) ×3 IMPLANT
GLOVE INDICATOR 7.0 STRL GRN (GLOVE) ×3 IMPLANT
GOWN STRL REUS W/TWL LRG LVL3 (GOWN DISPOSABLE) ×9 IMPLANT
IV NS IRRIG 3000ML ARTHROMATIC (IV SOLUTION) ×6 IMPLANT
MYOSURE XL FIBROID REM (MISCELLANEOUS)
PACK VAGINAL MINOR WOMEN LF (CUSTOM PROCEDURE TRAY) ×3 IMPLANT
PAD OB MATERNITY 4.3X12.25 (PERSONAL CARE ITEMS) ×3 IMPLANT
PAD PREP 24X48 CUFFED NSTRL (MISCELLANEOUS) ×3 IMPLANT
SEAL ROD LENS SCOPE MYOSURE (ABLATOR) ×3 IMPLANT
SYSTEM TISS REMOVAL MYSR XL RM (MISCELLANEOUS) IMPLANT
TOWEL OR 17X24 6PK STRL BLUE (TOWEL DISPOSABLE) ×6 IMPLANT
TUBING AQUILEX INFLOW (TUBING) ×3 IMPLANT
TUBING AQUILEX OUTFLOW (TUBING) ×3 IMPLANT

## 2017-07-02 NOTE — Anesthesia Procedure Notes (Signed)
Procedure Name: LMA Insertion Date/Time: 07/02/2017 7:41 AM Performed by: Mechele Claude, CRNA Pre-anesthesia Checklist: Patient identified, Emergency Drugs available, Suction available and Patient being monitored Patient Re-evaluated:Patient Re-evaluated prior to induction Oxygen Delivery Method: Circle system utilized Preoxygenation: Pre-oxygenation with 100% oxygen Induction Type: IV induction Ventilation: Mask ventilation without difficulty LMA: LMA inserted LMA Size: 4.0 Number of attempts: 1 Airway Equipment and Method: Bite block Placement Confirmation: positive ETCO2 Tube secured with: Tape Dental Injury: Teeth and Oropharynx as per pre-operative assessment

## 2017-07-02 NOTE — Anesthesia Postprocedure Evaluation (Signed)
Anesthesia Post Note  Patient: [name]  Procedure(s) Performed: DILATATION & CURETTAGE/HYSTEROSCOPY WITH MYOSURE (N/A Uterus)     Patient location during evaluation: PACU Anesthesia Type: General Level of consciousness: awake and alert Pain management: pain level controlled Vital Signs Assessment: post-procedure vital signs reviewed and stable Respiratory status: spontaneous breathing, nonlabored ventilation and respiratory function stable Cardiovascular status: blood pressure returned to baseline and stable Postop Assessment: no apparent nausea or vomiting Anesthetic complications: no    Last Vitals:  Vitals:   07/02/17 0845 07/02/17 0900  BP: 102/71 102/75  Pulse: 63 (!) 59  Resp: 14 12  Temp:    SpO2: 99% 99%    Last Pain:  Vitals:   07/02/17 0900  TempSrc:   PainSc: [place]

## 2017-07-02 NOTE — Op Note (Addendum)
Operative Note  07/02/2017  8:12 AM  PATIENT:  Tiffany Bond  47 y.o. female  PRE-OPERATIVE DIAGNOSIS:  interuterine polyp  POST-OPERATIVE DIAGNOSIS:  interuterine polyp  PROCEDURE:  Procedure(s): DILATATION & CURETTAGE/HYSTEROSCOPY WITH MYOSURE  SURGEON:  Surgeon(s): Princess Bruins, MD  ANESTHESIA:   general  FINDINGS: Many small endometrial polyps  DESCRIPTION OF OPERATION: Under general anesthesia with laryngeal mask, the patient is in lithotomy position.  She is prepped with Betadine on the suprapubic, vulvar and vaginal areas.  She is draped as usual.  The bladder is catheterized.  Timeout is done.  The vaginal exam reveals a retroverted uterus, normal volume.  No adnexal mass.  The speculum is inserted in the vagina.  The anterior lip of the cervix is grasped with a tenaculum.  A paracervical block is done with lidocaine 1% a total of 20 cc at 4 and 8:00.  Dilation of the cervix with Pratt dilators up to #27 without difficulty.  Insertion of the hysteroscope in the intrauterine cavity.  Inspection of the intrauterine cavity reveals many small polyps.  The endometrium is otherwise atrophic.  The ostia or well seen.  Pictures are taken.  We inserted the Myosure Reach.  Complete excision of all the endometrial polyps.  Pictures are taken after excisions.  The hysteroscope with Myosure is removed.  A systematic endometrial curettage was done with a sharp curette on all intra-uterine surfaces.  The specimen was very very light.  Both specimens were sent to pathology.  The tenaculum was removed from the cervix.  Good hemostasis.  The speculum was removed.  The patient was brought to recovery room in good and stable status.  ESTIMATED BLOOD LOSS: 5 cc   Intake/Output Summary (Last 24 hours) at 07/02/2017 1157 Last data filed at 07/02/2017 0757 Gross per 24 hour  Intake 800 ml  Output -  Net 800 ml     BLOOD ADMINISTERED:general   LOCAL MEDICATIONS USED:  NESACAINE 1% for  Paracervical block  SPECIMEN:  Source of Specimen:  Resection of Polyps and Endometrial curettings  DISPOSITION OF SPECIMEN:  PATHOLOGY  COUNTS:  YES  PLAN OF CARE: Transfer to PACU  Marie-Lyne LavoieMD8:12 AM

## 2017-07-02 NOTE — Discharge Instructions (Addendum)
Post Anesthesia Home Care Instructions  Activity: Get plenty of rest for the remainder of the day. A responsible individual must stay with you for 24 hours following the procedure.  For the next 24 hours, DO NOT: -Drive a car -Paediatric nurse -Drink alcoholic beverages -Take any medication unless instructed by your physician -Make any legal decisions or sign important papers.  Meals: Start with liquid foods such as gelatin or soup. Progress to regular foods as tolerated. Avoid greasy, spicy, heavy foods. If nausea and/or vomiting occur, drink only clear liquids until the nausea and/or vomiting subsides. Call your physician if vomiting continues.  Special Instructions/Symptoms: Your throat may feel dry or sore from the anesthesia or the breathing tube placed in your throat during surgery. If this causes discomfort, gargle with warm salt water. The discomfort should disappear within 24 hours.  If you had a scopolamine patch placed behind your ear for the management of post- operative nausea and/or vomiting:  1. The medication in the patch is effective for 72 hours, after which it should be removed.  Wrap patch in a tissue and discard in the trash. Wash hands thoroughly with soap and water. 2. You may remove the patch earlier than 72 hours if you experience unpleasant side effects which may include dry mouth, dizziness or visual disturbances. 3. Avoid touching the patch. Wash your hands with soap and water after contact with the patch.  May take Ibuprofen, Advil, Motrin, or Aleve after 2 PM.   Hysteroscopy, Care After Refer to this sheet in the next few weeks. These instructions provide you with information on caring for yourself after your procedure. Your health care provider may also give you more specific instructions. Your treatment has been planned according to current medical practices, but problems sometimes occur. Call your health care provider if you have any problems or questions  after your procedure. What can I expect after the procedure? After your procedure, it is typical to have the following:  You may have some cramping. This normally lasts for a couple days.  You may have bleeding. This can vary from light spotting for a few days to menstrual-like bleeding for 3-7 days.  Follow these instructions at home:  Rest for the first 1-2 days after the procedure.  Only take over-the-counter or prescription medicines as directed by your health care provider. Do not take aspirin. It can increase the chances of bleeding.  Take showers instead of baths for 2 weeks or as directed by your health care provider.  Do not drive for 24 hours or as directed.  Do not drink alcohol while taking pain medicine.  Do not use tampons, douche, or have sexual intercourse for 2 weeks or until your health care provider says it is okay.  Take your temperature twice a day for 4-5 days. Write it down each time.  Follow your health care provider's advice about diet, exercise, and lifting.  If you develop constipation, you may: ? Take a mild laxative if your health care provider approves. ? Add bran foods to your diet. ? Drink enough fluids to keep your urine clear or pale yellow.  Try to have someone with you or available to you for the first 24-48 hours, especially if you were given a general anesthetic.  Follow up with your health care provider as directed. Contact a health care provider if:  You feel dizzy or lightheaded.  You feel sick to your stomach (nauseous).  You have abnormal vaginal discharge.  You have a  rash.  You have pain that is not controlled with medicine. Get help right away if:  You have bleeding that is heavier than a normal menstrual period.  You have a fever.  You have increasing cramps or pain, not controlled with medicine.  You have new belly (abdominal) pain.  You pass out.  You have pain in the tops of your shoulders (shoulder strap  areas).  You have shortness of breath. This information is not intended to replace advice given to you by your health care provider. Make sure you discuss any questions you have with your health care provider. Document Released: 01/25/2013 Document Revised: 09/12/2015 Document Reviewed: 11/03/2012 Elsevier Interactive Patient Education  2017 [employer].

## 2017-07-02 NOTE — Transfer of Care (Signed)
Last Vitals:  Vitals:   07/02/17 0536 07/02/17 0819  BP: 110/75 102/76  Pulse: 79 76  Resp: 16 10  Temp: 37.1 C 36.8 C  SpO2: 100% 100%    Last Pain:  Vitals:   07/02/17 0536  TempSrc: Oral      Patients Stated Pain Goal: 5 (07/02/17 8295)  Immediate Anesthesia Transfer of Care Note  Patient: Tiffany Bond  Procedure(s) Performed: Procedure(s) (LRB): DILATATION & CURETTAGE/HYSTEROSCOPY WITH MYOSURE (N/A)  Patient Location: PACU  Anesthesia Type: General  Level of Consciousness: awake, alert  and oriented  Airway & Oxygen Therapy: Patient Spontanous Breathing and Patient connected to nasal cannula oxygen  Post-op Assessment: Report given to PACU RN and Post -op Vital signs reviewed and stable  Post vital signs: Reviewed and stable  Complications: No apparent anesthesia complications

## 2017-07-02 NOTE — H&P (Signed)
Tiffany Bond is an 47 y.o. female. [id number] Married  RP: PMB/Endometrial Polyps for [facility] Resection/D+C  HPI: No change since visit on 06/16/2017 when we noted:  Started on Progestin only pill x 05/13/2017.Had a light menstrualperiod on it June 06, 2017. No pelvic pain currently.  Pelvic US on 06/09/2017 showed an IU focus 1.2 x 0.7 cm.  The Sonohystero on 2/27 showed defects measuring 7 x 6 mm and 8 x 5 mm.  The intramural fibroid was not encroaching the endometrial cavity.   Pertinent Gynecological History: Contraception: Post menopausal/Condoms Blood transfusions: None Sexually transmitted diseases: Negative Last mammogram: Normal Last pap: Normal   Menstrual History: Patient's last menstrual period was 06/16/2017 (exact date).    Past Medical History:  Diagnosis Date  . Abnormal uterine bleeding (AUB)   . Endometrial polyp   . 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 Lesion of uterus   . Wears contact lenses     Past Surgical History:  Procedure Laterality Date  . INCISION AND DRAINAGE/ DEBRIDEMENT CHEST CAVITY  1997   total 10 sx's for infected left neck lymph node rupture into chest cavity    History reviewed. No pertinent family history.  Social History:  reports that  has never smoked. she has never used smokeless tobacco. She reports that she does not drink alcohol or use drugs.  Allergies:  Allergies  Allergen Reactions  . Vancomycin Anaphylaxis and Hives  . Latex Itching and Rash    Medications Prior to Admission  Medication Sig Dispense Refill Last Dose  . norethindrone (MICRONOR,CAMILA,ERRIN) 0.35 MG tablet Take 1 tablet (0.35 mg total) by mouth daily. (Patient taking differently: Take 1 tablet by mouth every morning.  ) 3 Package 4 07/02/2017 at 0500    REVIEW OF SYSTEMS: A ROS was performed and pertinent positives and negatives are included in the history.  GENERAL: No fevers or chills. HEENT: No change in vision, no earache, sore throat or sinus congestion. NECK: No pain or stiffness. CARDIOVASCULAR: No chest pain or pressure. No palpitations. PULMONARY: No shortness of breath, cough or wheeze. GASTROINTESTINAL: No abdominal pain, nausea, vomiting or diarrhea, melena or bright red blood per rectum. GENITOURINARY: No urinary frequency, urgency, hesitancy or dysuria. MUSCULOSKELETAL: No joint or muscle pain, no back pain, no recent trauma. DERMATOLOGIC: No rash, no itching, no lesions. ENDOCRINE: No polyuria, polydipsia, no heat or cold intolerance. No recent change in weight. HEMATOLOGICAL: No anemia or easy bruising or bleeding. NEUROLOGIC: No headache, seizures, numbness, tingling or weakness. PSYCHIATRIC: No depression, no loss of interest in normal activity or change in sleep pattern.     Blood pressure 110/75, pulse 79, temperature 98.7 F (37.1 C), temperature source Oral, resp. rate 16, height 5' (1.524 m), weight 173 lb 9.6 oz (78.7 kg), last menstrual period 06/16/2017, SpO2 100 %.  Physical Exam:  See office notes   Results for orders placed or performed during the hospital encounter of 07/02/17 (from the past 24 hour(s))  Pregnancy, urine POC     Status: None   Collection Time: 07/02/17  5:50 AM  Result Value Ref Range   Preg Test, Ur NEGATIVE NEGATIVE    Assessment/Plan:  1. Postmenopausal bleeding Probably due to endometrial polyps  2. Endometrial polyps 2 intrauterine defects seen on sonohysterogram measuring 7  mm and 8 mm, probably corresponding to endometrial polyps.  Hysteroscopy with Myosure resection of polyps and D&C today.  Surgery and risks reviewed.    Tiffany Bond 07/02/2017, 6:58 AM

## 2017-07-02 NOTE — Anesthesia Preprocedure Evaluation (Signed)
Anesthesia Evaluation  Patient identified by MRN, date of birth, ID band Patient awake    Reviewed: Allergy & Precautions, NPO status , Patient's Chart, lab work & pertinent test results  Airway Mallampati: II  TM Distance: >3 FB Neck ROM: Full    Dental no notable dental hx.    Pulmonary neg pulmonary ROS,    Pulmonary exam normal breath sounds clear to auscultation       Cardiovascular negative cardio ROS Normal cardiovascular exam Rhythm:Regular Rate:Normal     Neuro/Psych negative neurological ROS  negative psych ROS   GI/Hepatic negative GI ROS, Neg liver ROS,   Endo/Other  negative endocrine ROS  Renal/GU negative Renal ROS  negative genitourinary   Musculoskeletal negative musculoskeletal ROS (+)   Abdominal (+) + obese,   Peds negative pediatric ROS (+)  Hematology negative hematology ROS (+)   Anesthesia Other Findings   Reproductive/Obstetrics negative OB ROS                             Anesthesia Physical Anesthesia Plan  ASA: II  Anesthesia Plan: General   Post-op Pain Management:    Induction: Intravenous  PONV Risk Score and Plan: 3 and Ondansetron, Dexamethasone and Midazolam  Airway Management Planned: LMA  Additional Equipment:   Intra-op Plan:   Post-operative Plan: Extubation in OR  Informed Consent: I have reviewed the patients History and Physical, chart, labs and discussed the procedure including the risks, benefits and alternatives for the proposed anesthesia with the patient or authorized representative who has indicated his/her understanding and acceptance.   Dental advisory given  Plan Discussed with: CRNA  Anesthesia Plan Comments:         Anesthesia Quick Evaluation  

## 2017-07-06 ENCOUNTER — Encounter (HOSPITAL_BASED_OUTPATIENT_CLINIC_OR_DEPARTMENT_OTHER): Payer: Self-pay | Admitting: Obstetrics & Gynecology

## 2017-08-03 ENCOUNTER — Telehealth: Payer: Self-pay | Admitting: *Deleted

## 2017-08-03 NOTE — Telephone Encounter (Signed)
Patient had D&C on 07/02/17 called today stating she has not had a cycle in 2 months, taking camila 0.35 mg tablet daily states she had 1 cycle before surgery, she asked about being in menopause I told her I doubt she would be in menopause as it has not been 1 year without cycle. I explained to patient no cycle may be due to taking the progesterone birth control as some patient has light to no bleeding. But I would run all this information by you, also it appears she never had a post-op visit after surgery, should she schedule now? Please advise

## 2017-08-03 NOTE — Telephone Encounter (Signed)
Pt informed, transferred to front desk scheduled to come tomorrow

## 2017-08-03 NOTE — Telephone Encounter (Signed)
Yes, schedule Postop please and will discuss Progestin pill/Oligo at that visit as well.

## 2017-08-04 ENCOUNTER — Ambulatory Visit: Payer: 59 | Admitting: Obstetrics & Gynecology

## 2017-08-20 ENCOUNTER — Ambulatory Visit (INDEPENDENT_AMBULATORY_CARE_PROVIDER_SITE_OTHER): Payer: 59 | Admitting: Obstetrics & Gynecology

## 2017-08-20 ENCOUNTER — Encounter: Payer: Self-pay | Admitting: Obstetrics & Gynecology

## 2017-08-20 VITALS — BP 116/72

## 2017-08-20 DIAGNOSIS — Z09 Encounter for follow-up examination after completed treatment for conditions other than malignant neoplasm: Secondary | ICD-10-CM

## 2017-08-20 NOTE — Progress Notes (Signed)
    Tiffany Bond [birth year] [id number]        47 y.o.  G2P0011   RP: 6 weeks post hysteroscopy excision/D&C  HPI: No pelvic pain and no vaginal bleeding since surgery.  Normal vaginal secretions.  No fever.   OB History  Gravida Para Term Preterm AB Living  2 1     1 1   SAB TAB Ectopic Multiple Live Births  1            # Outcome Date GA Lbr Len/2nd Weight Sex Delivery Anes PTL Lv  2 SAB           1 Para             Past medical history,surgical history, problem list, medications, allergies, family history and social history were all reviewed and documented in the EPIC chart.   Directed ROS with pertinent positives and negatives documented in the history of present illness/assessment and plan.  Exam:  Vitals:   08/20/17 1229  BP: 116/72   General appearance:  Normal  Abdomen: Normal  Gynecologic exam: Vulva normal.  Bimanual exam: Uterus retroverted, normal volume and nontender.  Cervix nontender to mobilization.  No adnexal mass, nontender.  Pathology July 02, 2017: Endometrium, curettage, with resection contents - BENIGN ENDOMETRIAL TYPE POLYP.  Assessment/Plan:  47 y.o. G2P0011   1. Status post gynecological surgery, follow-up exam Good postop evolution and healing.  Surgical findings reviewed with patient and reassured about her benign pathology report.  Patient will follow-up at her next annual gynecologic exam.  Princess Bruins MD, 12:55 PM 08/20/2017

## 2017-08-21 ENCOUNTER — Encounter: Payer: Self-pay | Admitting: Obstetrics & Gynecology

## 2017-08-21 NOTE — Patient Instructions (Signed)
1. Status post gynecological surgery, follow-up exam Good postop evolution and healing.  Surgical findings reviewed with patient and reassured about her benign pathology report.  Patient will follow-up at her next annual gynecologic exam.  Tiffany Bond, good seeing you today!

## 2018-04-07 ENCOUNTER — Other Ambulatory Visit: Payer: Self-pay | Admitting: Obstetrics & Gynecology

## 2018-04-21 ENCOUNTER — Telehealth: Payer: Self-pay | Admitting: *Deleted

## 2018-04-21 MED ORDER — NORETHINDRONE 0.35 MG PO TABS: 1.0000 | ORAL_TABLET | Freq: Every day | ORAL | 0 refills | Status: DC

## 2018-04-21 NOTE — Telephone Encounter (Signed)
[place], new patient appointment with GYN is scheduled in March. Patient called requesting 3 month supply of Camilia. Okay to send?

## 2018-04-21 NOTE — Telephone Encounter (Signed)
Yes, agree with Camila until Aex/gyn.

## 2018-04-21 NOTE — Telephone Encounter (Signed)
Rx sent with 3 packs.

## 2018-07-06 ENCOUNTER — Other Ambulatory Visit: Payer: Self-pay | Admitting: Obstetrics & Gynecology

## 2018-07-22 ENCOUNTER — Telehealth: Payer: Self-pay | Admitting: *Deleted

## 2018-07-22 MED ORDER — NORETHINDRONE 0.35 MG PO TABS: 1.0000 | ORAL_TABLET | Freq: Every day | ORAL | 1 refills | Status: DC

## 2018-07-22 NOTE — Telephone Encounter (Signed)
Patient called [place], new GYN appointment has been canceled due to COVID-19 that office is only seeing patient with problems. Patient asked if refill could be given 2 pack sent.

## 2018-09-25 ENCOUNTER — Other Ambulatory Visit: Payer: Self-pay | Admitting: Obstetrics & Gynecology

## 2018-10-20 ENCOUNTER — Other Ambulatory Visit: Payer: Self-pay | Admitting: Obstetrics & Gynecology
